# Patient Record
Sex: Female | Born: 1950 | Hispanic: No | Smoking: Former smoker
Health system: Southern US, Community
[De-identification: ages and names within clinical notes are randomized; demographics above are authoritative.]

## PROBLEM LIST (undated history)

## (undated) DIAGNOSIS — F419 Anxiety disorder, unspecified: Secondary | ICD-10-CM

## (undated) HISTORY — DX: Anxiety disorder, unspecified: F41.9

## (undated) HISTORY — PX: BLADDER SUSPENSION: SHX72

## (undated) HISTORY — PX: TUBAL LIGATION: SHX77

## (undated) HISTORY — PX: INCONTINENCE SURGERY: SHX676

---

## 1998-07-07 ENCOUNTER — Other Ambulatory Visit: Admission: RE | Admit: 1998-07-07 | Discharge: 1998-07-07 | Payer: Self-pay | Admitting: Gynecology

## 2000-12-11 ENCOUNTER — Other Ambulatory Visit: Admission: RE | Admit: 2000-12-11 | Discharge: 2000-12-11 | Payer: Self-pay | Admitting: Gynecology

## 2003-08-29 ENCOUNTER — Other Ambulatory Visit: Admission: RE | Admit: 2003-08-29 | Discharge: 2003-08-29 | Payer: Self-pay | Admitting: Obstetrics and Gynecology

## 2004-12-31 ENCOUNTER — Other Ambulatory Visit: Admission: RE | Admit: 2004-12-31 | Discharge: 2004-12-31 | Payer: Self-pay | Admitting: Obstetrics and Gynecology

## 2009-08-06 ENCOUNTER — Other Ambulatory Visit: Admission: RE | Admit: 2009-08-06 | Discharge: 2009-08-06 | Payer: Self-pay | Admitting: Obstetrics and Gynecology

## 2012-02-06 ENCOUNTER — Other Ambulatory Visit (HOSPITAL_COMMUNITY): Payer: Self-pay | Admitting: Gynecology

## 2012-02-06 DIAGNOSIS — N950 Postmenopausal bleeding: Secondary | ICD-10-CM

## 2012-02-09 ENCOUNTER — Ambulatory Visit (HOSPITAL_COMMUNITY): Payer: Self-pay

## 2015-01-28 ENCOUNTER — Ambulatory Visit: Payer: Self-pay | Admitting: Internal Medicine

## 2015-10-28 DIAGNOSIS — Z960 Presence of urogenital implants: Secondary | ICD-10-CM | POA: Diagnosis not present

## 2015-10-28 DIAGNOSIS — N8110 Cystocele, unspecified: Secondary | ICD-10-CM | POA: Diagnosis not present

## 2015-10-28 DIAGNOSIS — N819 Female genital prolapse, unspecified: Secondary | ICD-10-CM | POA: Diagnosis not present

## 2015-12-22 DIAGNOSIS — H2513 Age-related nuclear cataract, bilateral: Secondary | ICD-10-CM | POA: Diagnosis not present

## 2015-12-22 DIAGNOSIS — H547 Unspecified visual loss: Secondary | ICD-10-CM | POA: Diagnosis not present

## 2015-12-28 DIAGNOSIS — E785 Hyperlipidemia, unspecified: Secondary | ICD-10-CM | POA: Diagnosis not present

## 2015-12-28 DIAGNOSIS — N8110 Cystocele, unspecified: Secondary | ICD-10-CM | POA: Diagnosis not present

## 2015-12-28 DIAGNOSIS — M8580 Other specified disorders of bone density and structure, unspecified site: Secondary | ICD-10-CM | POA: Diagnosis not present

## 2015-12-28 DIAGNOSIS — Z0000 Encounter for general adult medical examination without abnormal findings: Secondary | ICD-10-CM | POA: Diagnosis not present

## 2016-02-01 DIAGNOSIS — N812 Incomplete uterovaginal prolapse: Secondary | ICD-10-CM | POA: Diagnosis not present

## 2016-05-31 DIAGNOSIS — N898 Other specified noninflammatory disorders of vagina: Secondary | ICD-10-CM | POA: Diagnosis not present

## 2016-05-31 DIAGNOSIS — N8111 Cystocele, midline: Secondary | ICD-10-CM | POA: Diagnosis not present

## 2016-07-20 DIAGNOSIS — N812 Incomplete uterovaginal prolapse: Secondary | ICD-10-CM | POA: Diagnosis not present

## 2016-07-29 DIAGNOSIS — N898 Other specified noninflammatory disorders of vagina: Secondary | ICD-10-CM | POA: Diagnosis not present

## 2016-09-22 DIAGNOSIS — Z124 Encounter for screening for malignant neoplasm of cervix: Secondary | ICD-10-CM | POA: Diagnosis not present

## 2016-09-22 DIAGNOSIS — N8110 Cystocele, unspecified: Secondary | ICD-10-CM | POA: Diagnosis not present

## 2016-10-12 DIAGNOSIS — N814 Uterovaginal prolapse, unspecified: Secondary | ICD-10-CM | POA: Diagnosis not present

## 2016-10-26 DIAGNOSIS — N814 Uterovaginal prolapse, unspecified: Secondary | ICD-10-CM | POA: Diagnosis not present

## 2016-11-30 DIAGNOSIS — N814 Uterovaginal prolapse, unspecified: Secondary | ICD-10-CM | POA: Diagnosis not present

## 2016-12-16 DIAGNOSIS — R110 Nausea: Secondary | ICD-10-CM | POA: Diagnosis not present

## 2016-12-16 DIAGNOSIS — R21 Rash and other nonspecific skin eruption: Secondary | ICD-10-CM | POA: Diagnosis not present

## 2016-12-16 DIAGNOSIS — N8110 Cystocele, unspecified: Secondary | ICD-10-CM | POA: Diagnosis not present

## 2016-12-16 DIAGNOSIS — R42 Dizziness and giddiness: Secondary | ICD-10-CM | POA: Diagnosis not present

## 2017-01-05 ENCOUNTER — Ambulatory Visit: Payer: PPO | Admitting: Neurology

## 2017-01-18 DIAGNOSIS — N952 Postmenopausal atrophic vaginitis: Secondary | ICD-10-CM | POA: Diagnosis not present

## 2017-01-18 DIAGNOSIS — N814 Uterovaginal prolapse, unspecified: Secondary | ICD-10-CM | POA: Diagnosis not present

## 2017-01-18 DIAGNOSIS — Z01419 Encounter for gynecological examination (general) (routine) without abnormal findings: Secondary | ICD-10-CM | POA: Diagnosis not present

## 2017-01-18 DIAGNOSIS — N8111 Cystocele, midline: Secondary | ICD-10-CM | POA: Diagnosis not present

## 2017-03-13 DIAGNOSIS — N3946 Mixed incontinence: Secondary | ICD-10-CM | POA: Diagnosis not present

## 2017-03-13 DIAGNOSIS — N813 Complete uterovaginal prolapse: Secondary | ICD-10-CM | POA: Diagnosis not present

## 2017-03-13 DIAGNOSIS — N950 Postmenopausal bleeding: Secondary | ICD-10-CM | POA: Diagnosis not present

## 2017-05-26 DIAGNOSIS — N8111 Cystocele, midline: Secondary | ICD-10-CM | POA: Diagnosis not present

## 2017-05-26 DIAGNOSIS — N819 Female genital prolapse, unspecified: Secondary | ICD-10-CM | POA: Diagnosis not present

## 2017-05-26 DIAGNOSIS — N813 Complete uterovaginal prolapse: Secondary | ICD-10-CM | POA: Diagnosis not present

## 2017-05-26 DIAGNOSIS — N816 Rectocele: Secondary | ICD-10-CM | POA: Diagnosis not present

## 2017-07-04 DIAGNOSIS — N814 Uterovaginal prolapse, unspecified: Secondary | ICD-10-CM | POA: Diagnosis not present

## 2017-07-26 DIAGNOSIS — N813 Complete uterovaginal prolapse: Secondary | ICD-10-CM | POA: Diagnosis not present

## 2017-07-26 DIAGNOSIS — N950 Postmenopausal bleeding: Secondary | ICD-10-CM | POA: Diagnosis not present

## 2017-08-04 DIAGNOSIS — N8110 Cystocele, unspecified: Secondary | ICD-10-CM | POA: Diagnosis not present

## 2017-08-04 DIAGNOSIS — N813 Complete uterovaginal prolapse: Secondary | ICD-10-CM | POA: Diagnosis not present

## 2017-08-08 DIAGNOSIS — N8110 Cystocele, unspecified: Secondary | ICD-10-CM | POA: Diagnosis not present

## 2017-08-08 DIAGNOSIS — N816 Rectocele: Secondary | ICD-10-CM | POA: Diagnosis not present

## 2017-08-31 DIAGNOSIS — N8110 Cystocele, unspecified: Secondary | ICD-10-CM | POA: Diagnosis not present

## 2017-09-28 DIAGNOSIS — N814 Uterovaginal prolapse, unspecified: Secondary | ICD-10-CM | POA: Diagnosis not present

## 2017-12-27 DIAGNOSIS — N814 Uterovaginal prolapse, unspecified: Secondary | ICD-10-CM | POA: Diagnosis not present

## 2018-01-04 DIAGNOSIS — Z1389 Encounter for screening for other disorder: Secondary | ICD-10-CM | POA: Diagnosis not present

## 2018-01-04 DIAGNOSIS — F5101 Primary insomnia: Secondary | ICD-10-CM | POA: Diagnosis not present

## 2018-01-04 DIAGNOSIS — Z1211 Encounter for screening for malignant neoplasm of colon: Secondary | ICD-10-CM | POA: Diagnosis not present

## 2018-01-04 DIAGNOSIS — Z975 Presence of (intrauterine) contraceptive device: Secondary | ICD-10-CM | POA: Diagnosis not present

## 2018-01-04 DIAGNOSIS — N8110 Cystocele, unspecified: Secondary | ICD-10-CM | POA: Diagnosis not present

## 2018-01-16 DIAGNOSIS — N814 Uterovaginal prolapse, unspecified: Secondary | ICD-10-CM | POA: Diagnosis not present

## 2018-06-05 DIAGNOSIS — N765 Ulceration of vagina: Secondary | ICD-10-CM | POA: Diagnosis not present

## 2018-06-05 DIAGNOSIS — N819 Female genital prolapse, unspecified: Secondary | ICD-10-CM | POA: Diagnosis not present

## 2018-07-03 DIAGNOSIS — N766 Ulceration of vulva: Secondary | ICD-10-CM | POA: Diagnosis not present

## 2018-07-03 DIAGNOSIS — N889 Noninflammatory disorder of cervix uteri, unspecified: Secondary | ICD-10-CM | POA: Diagnosis not present

## 2018-07-03 DIAGNOSIS — N814 Uterovaginal prolapse, unspecified: Secondary | ICD-10-CM | POA: Diagnosis not present

## 2018-07-03 DIAGNOSIS — N952 Postmenopausal atrophic vaginitis: Secondary | ICD-10-CM | POA: Diagnosis not present

## 2018-07-03 DIAGNOSIS — N72 Inflammatory disease of cervix uteri: Secondary | ICD-10-CM | POA: Diagnosis not present

## 2018-07-03 DIAGNOSIS — Z87891 Personal history of nicotine dependence: Secondary | ICD-10-CM | POA: Diagnosis not present

## 2018-07-03 DIAGNOSIS — Z01419 Encounter for gynecological examination (general) (routine) without abnormal findings: Secondary | ICD-10-CM | POA: Diagnosis not present

## 2018-07-03 DIAGNOSIS — Z124 Encounter for screening for malignant neoplasm of cervix: Secondary | ICD-10-CM | POA: Diagnosis not present

## 2018-07-17 DIAGNOSIS — Z0000 Encounter for general adult medical examination without abnormal findings: Secondary | ICD-10-CM | POA: Diagnosis not present

## 2018-07-17 DIAGNOSIS — F5101 Primary insomnia: Secondary | ICD-10-CM | POA: Diagnosis not present

## 2018-07-17 DIAGNOSIS — M8588 Other specified disorders of bone density and structure, other site: Secondary | ICD-10-CM | POA: Diagnosis not present

## 2018-07-17 DIAGNOSIS — E559 Vitamin D deficiency, unspecified: Secondary | ICD-10-CM | POA: Diagnosis not present

## 2018-07-17 DIAGNOSIS — E785 Hyperlipidemia, unspecified: Secondary | ICD-10-CM | POA: Diagnosis not present

## 2018-07-17 DIAGNOSIS — N8110 Cystocele, unspecified: Secondary | ICD-10-CM | POA: Diagnosis not present

## 2018-07-24 DIAGNOSIS — N889 Noninflammatory disorder of cervix uteri, unspecified: Secondary | ICD-10-CM | POA: Diagnosis not present

## 2018-08-14 DIAGNOSIS — Z8742 Personal history of other diseases of the female genital tract: Secondary | ICD-10-CM | POA: Diagnosis not present

## 2018-08-14 DIAGNOSIS — N813 Complete uterovaginal prolapse: Secondary | ICD-10-CM | POA: Diagnosis not present

## 2018-08-14 DIAGNOSIS — N816 Rectocele: Secondary | ICD-10-CM | POA: Diagnosis not present

## 2018-08-14 DIAGNOSIS — N8110 Cystocele, unspecified: Secondary | ICD-10-CM | POA: Diagnosis not present

## 2018-09-18 DIAGNOSIS — N8110 Cystocele, unspecified: Secondary | ICD-10-CM | POA: Diagnosis not present

## 2018-09-18 DIAGNOSIS — N814 Uterovaginal prolapse, unspecified: Secondary | ICD-10-CM | POA: Diagnosis not present

## 2018-09-18 DIAGNOSIS — N816 Rectocele: Secondary | ICD-10-CM | POA: Diagnosis not present

## 2019-02-20 DIAGNOSIS — R05 Cough: Secondary | ICD-10-CM | POA: Diagnosis not present

## 2019-03-11 DIAGNOSIS — N8110 Cystocele, unspecified: Secondary | ICD-10-CM | POA: Diagnosis not present

## 2019-05-08 DIAGNOSIS — N814 Uterovaginal prolapse, unspecified: Secondary | ICD-10-CM | POA: Diagnosis not present

## 2019-05-08 DIAGNOSIS — N813 Complete uterovaginal prolapse: Secondary | ICD-10-CM | POA: Diagnosis not present

## 2019-05-08 DIAGNOSIS — N950 Postmenopausal bleeding: Secondary | ICD-10-CM | POA: Diagnosis not present

## 2019-06-13 DIAGNOSIS — N939 Abnormal uterine and vaginal bleeding, unspecified: Secondary | ICD-10-CM | POA: Diagnosis not present

## 2019-06-13 DIAGNOSIS — R198 Other specified symptoms and signs involving the digestive system and abdomen: Secondary | ICD-10-CM | POA: Diagnosis not present

## 2019-06-13 DIAGNOSIS — R05 Cough: Secondary | ICD-10-CM | POA: Diagnosis not present

## 2019-06-17 ENCOUNTER — Other Ambulatory Visit: Payer: Self-pay

## 2019-06-17 ENCOUNTER — Ambulatory Visit
Admission: RE | Admit: 2019-06-17 | Discharge: 2019-06-17 | Disposition: A | Discharge status: Home / Self Care | Payer: PPO | Source: Ambulatory Visit | Attending: Family Medicine | Admitting: Family Medicine

## 2019-06-17 ENCOUNTER — Other Ambulatory Visit: Payer: Self-pay | Admitting: Family Medicine

## 2019-06-17 DIAGNOSIS — R05 Cough: Secondary | ICD-10-CM | POA: Diagnosis not present

## 2019-06-17 DIAGNOSIS — R059 Cough, unspecified: Secondary | ICD-10-CM

## 2019-06-20 DIAGNOSIS — N765 Ulceration of vagina: Secondary | ICD-10-CM | POA: Diagnosis not present

## 2019-06-20 DIAGNOSIS — N813 Complete uterovaginal prolapse: Secondary | ICD-10-CM | POA: Diagnosis not present

## 2019-06-25 DIAGNOSIS — N8110 Cystocele, unspecified: Secondary | ICD-10-CM | POA: Diagnosis not present

## 2019-06-28 DIAGNOSIS — Z01810 Encounter for preprocedural cardiovascular examination: Secondary | ICD-10-CM | POA: Diagnosis not present

## 2019-06-28 DIAGNOSIS — N8110 Cystocele, unspecified: Secondary | ICD-10-CM | POA: Diagnosis not present

## 2019-06-28 DIAGNOSIS — Z20828 Contact with and (suspected) exposure to other viral communicable diseases: Secondary | ICD-10-CM | POA: Diagnosis not present

## 2019-06-28 DIAGNOSIS — Z01812 Encounter for preprocedural laboratory examination: Secondary | ICD-10-CM | POA: Diagnosis not present

## 2019-06-28 DIAGNOSIS — R001 Bradycardia, unspecified: Secondary | ICD-10-CM | POA: Diagnosis not present

## 2019-07-04 DIAGNOSIS — N816 Rectocele: Secondary | ICD-10-CM | POA: Diagnosis not present

## 2019-07-04 DIAGNOSIS — Z9889 Other specified postprocedural states: Secondary | ICD-10-CM | POA: Diagnosis not present

## 2019-07-04 DIAGNOSIS — N8110 Cystocele, unspecified: Secondary | ICD-10-CM | POA: Diagnosis not present

## 2019-07-04 DIAGNOSIS — N813 Complete uterovaginal prolapse: Secondary | ICD-10-CM | POA: Diagnosis not present

## 2019-07-22 DIAGNOSIS — N8110 Cystocele, unspecified: Secondary | ICD-10-CM | POA: Diagnosis not present

## 2019-09-24 ENCOUNTER — Other Ambulatory Visit: Payer: Self-pay | Admitting: Family Medicine

## 2019-09-24 DIAGNOSIS — Z1231 Encounter for screening mammogram for malignant neoplasm of breast: Secondary | ICD-10-CM

## 2019-09-30 DIAGNOSIS — N8110 Cystocele, unspecified: Secondary | ICD-10-CM | POA: Diagnosis not present

## 2020-01-14 DIAGNOSIS — F419 Anxiety disorder, unspecified: Secondary | ICD-10-CM | POA: Diagnosis not present

## 2020-01-21 DIAGNOSIS — R42 Dizziness and giddiness: Secondary | ICD-10-CM | POA: Diagnosis not present

## 2020-01-21 DIAGNOSIS — R05 Cough: Secondary | ICD-10-CM | POA: Diagnosis not present

## 2020-01-28 DIAGNOSIS — I8310 Varicose veins of unspecified lower extremity with inflammation: Secondary | ICD-10-CM | POA: Diagnosis not present

## 2020-03-09 DIAGNOSIS — K219 Gastro-esophageal reflux disease without esophagitis: Secondary | ICD-10-CM | POA: Diagnosis not present

## 2020-03-09 DIAGNOSIS — R6889 Other general symptoms and signs: Secondary | ICD-10-CM | POA: Diagnosis not present

## 2020-03-09 DIAGNOSIS — H6121 Impacted cerumen, right ear: Secondary | ICD-10-CM | POA: Diagnosis not present

## 2020-03-09 DIAGNOSIS — R42 Dizziness and giddiness: Secondary | ICD-10-CM | POA: Diagnosis not present

## 2020-03-09 DIAGNOSIS — Z87898 Personal history of other specified conditions: Secondary | ICD-10-CM | POA: Diagnosis not present

## 2020-05-28 DIAGNOSIS — R21 Rash and other nonspecific skin eruption: Secondary | ICD-10-CM | POA: Diagnosis not present

## 2020-05-28 DIAGNOSIS — J358 Other chronic diseases of tonsils and adenoids: Secondary | ICD-10-CM | POA: Diagnosis not present

## 2020-06-30 DIAGNOSIS — L708 Other acne: Secondary | ICD-10-CM | POA: Diagnosis not present

## 2020-06-30 DIAGNOSIS — L820 Inflamed seborrheic keratosis: Secondary | ICD-10-CM | POA: Diagnosis not present

## 2020-07-03 DIAGNOSIS — N8110 Cystocele, unspecified: Secondary | ICD-10-CM | POA: Diagnosis not present

## 2020-12-06 IMAGING — CR CHEST - 2 VIEW
2 series · 2 of 2 positions shown · non-contrast
Comparison: None.

CLINICAL DATA: Pt had dry cough since [DATE], no other symptoms,
non-smoker, not a call [id number]

EXAM:
CHEST - 2 VIEW

[w chest pa]
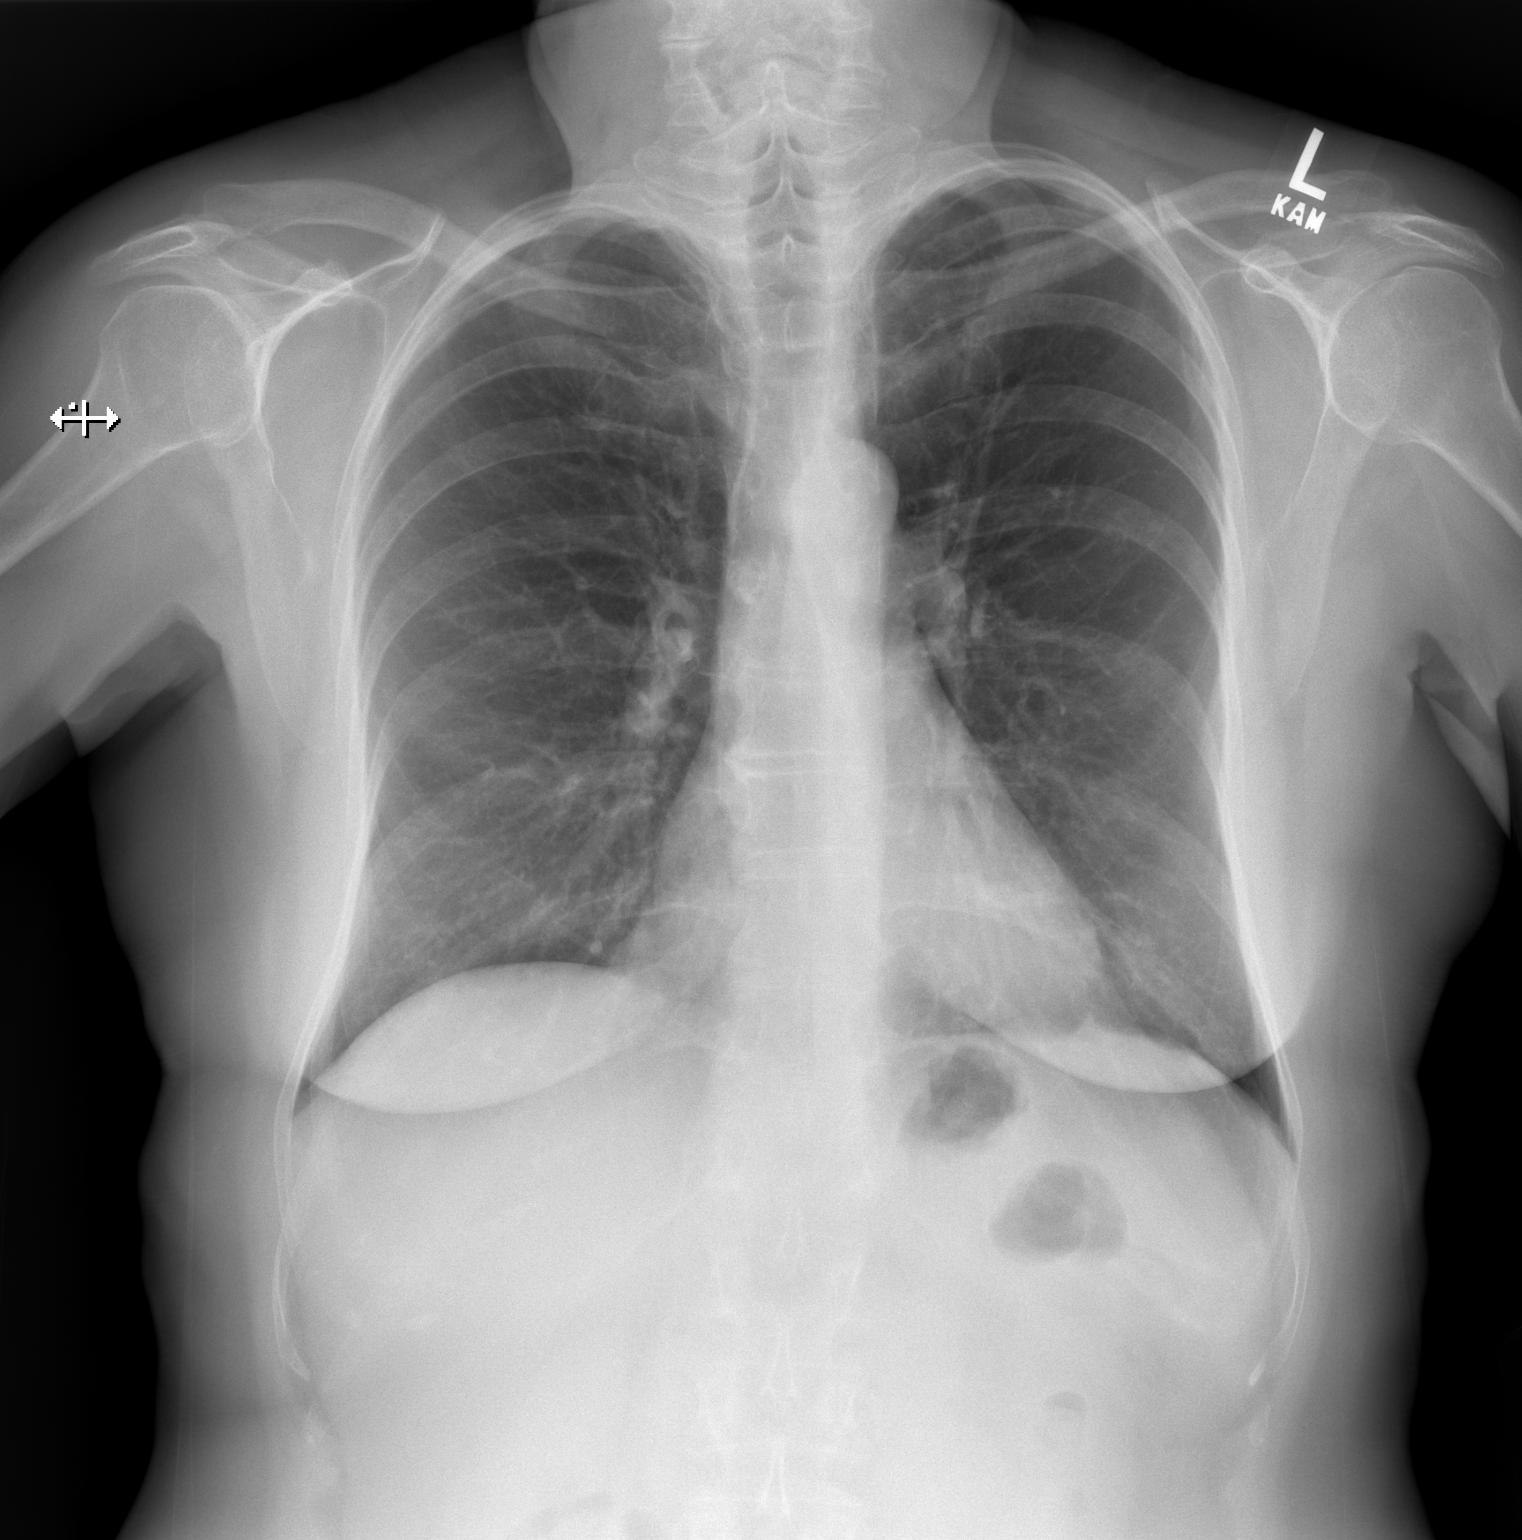

[w chest lat]
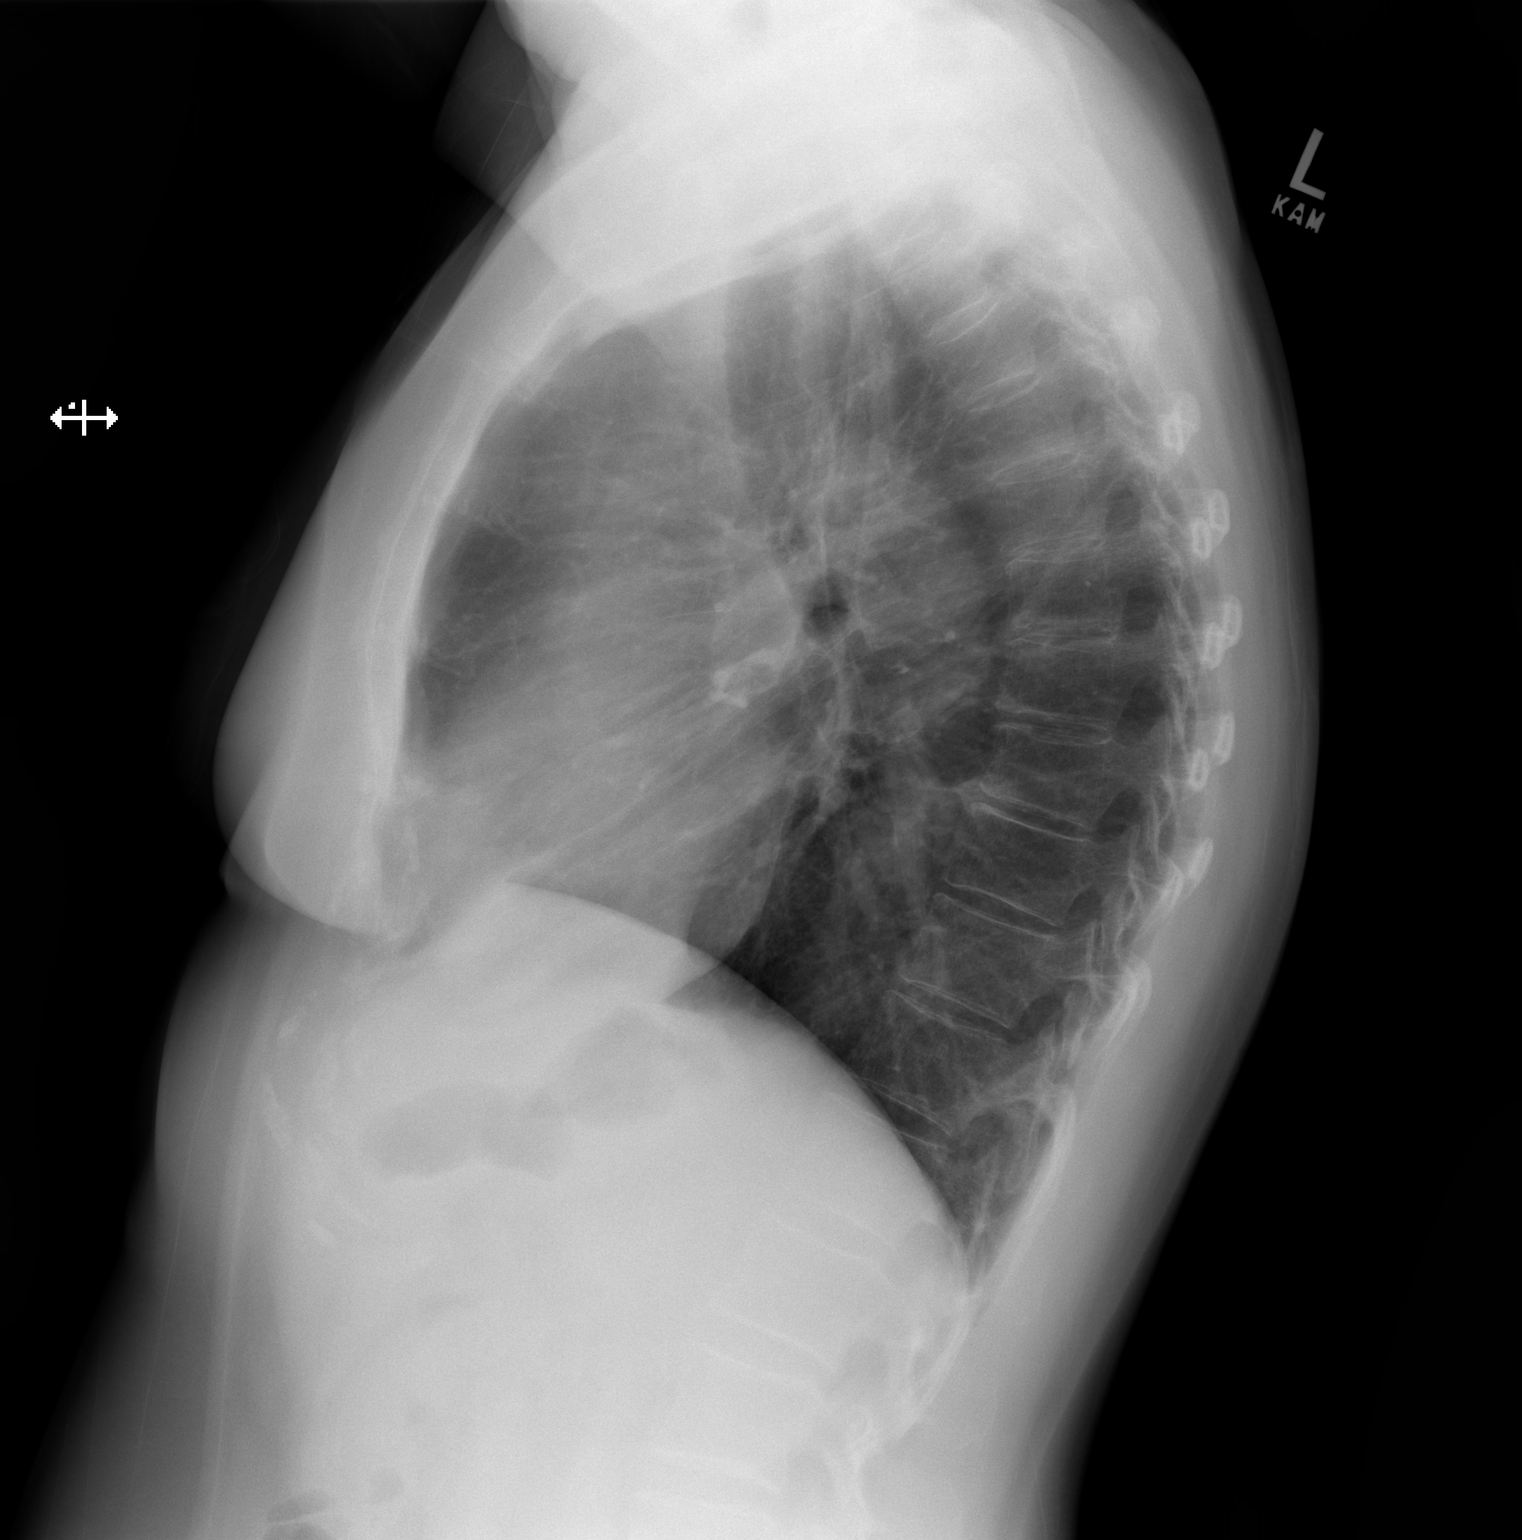

[2 of 2 positions shown; findings below may reference images not displayed]

FINDINGS: The heart size and mediastinal contours are within normal limits.
The lungs are clear. No pneumothorax or pleural effusion. The
visualized skeletal structures and upper abdomen are unremarkable.
IMPRESSION: No active cardiopulmonary disease.

## 2021-01-30 ENCOUNTER — Other Ambulatory Visit: Payer: Self-pay | Admitting: Family Medicine

## 2021-01-30 DIAGNOSIS — N951 Menopausal and female climacteric states: Secondary | ICD-10-CM

## 2021-02-05 ENCOUNTER — Other Ambulatory Visit: Payer: Self-pay | Admitting: Family Medicine

## 2021-02-05 DIAGNOSIS — Z1231 Encounter for screening mammogram for malignant neoplasm of breast: Secondary | ICD-10-CM

## 2021-07-14 ENCOUNTER — Other Ambulatory Visit: Payer: PPO

## 2022-06-14 ENCOUNTER — Ambulatory Visit (INDEPENDENT_AMBULATORY_CARE_PROVIDER_SITE_OTHER): Payer: PPO | Admitting: Obstetrics and Gynecology

## 2022-06-14 ENCOUNTER — Other Ambulatory Visit (HOSPITAL_COMMUNITY)
Admission: RE | Admit: 2022-06-14 | Discharge: 2022-06-14 | Disposition: A | Discharge status: Home / Self Care | Payer: PPO | Attending: Obstetrics and Gynecology | Admitting: Obstetrics and Gynecology

## 2022-06-14 ENCOUNTER — Encounter: Payer: Self-pay | Admitting: Obstetrics and Gynecology

## 2022-06-14 VITALS — BP 129/75 | HR 58 | Ht 62.0 in | Wt 138.0 lb

## 2022-06-14 DIAGNOSIS — N816 Rectocele: Secondary | ICD-10-CM | POA: Insufficient documentation

## 2022-06-14 DIAGNOSIS — R350 Frequency of micturition: Secondary | ICD-10-CM

## 2022-06-14 DIAGNOSIS — R82998 Other abnormal findings in urine: Secondary | ICD-10-CM

## 2022-06-14 DIAGNOSIS — N952 Postmenopausal atrophic vaginitis: Secondary | ICD-10-CM | POA: Diagnosis not present

## 2022-06-14 LAB — POCT URINALYSIS DIPSTICK
Bilirubin, UA: NEGATIVE
Blood, UA: NEGATIVE
Glucose, UA: NEGATIVE
Ketones, UA: NEGATIVE
Nitrite, UA: NEGATIVE
Protein, UA: NEGATIVE
Spec Grav, UA: 1.025 (ref 1.010–1.025)
Urobilinogen, UA: 0.2 E.U./dL
pH, UA: 6.5 (ref 5.0–8.0)

## 2022-06-14 MED ORDER — ESTRADIOL 0.1 MG/GM VA CREA: TOPICAL_CREAM | VAGINAL | 11 refills | Status: AC

## 2022-06-14 NOTE — Progress Notes (Unsigned)
[facility] New Patient Evaluation and Consultation  Referring Provider: Laurann Montana, MD PCP: Laurann Montana, MD Date of Service: 06/14/2022  SUBJECTIVE Chief Complaint: New Patient (Initial Visit) Allison Richards is a 71 y.o. female here for a consult for bleeding after an ablation. Pt said she also wants surgery for prolapse)  History of Present Illness: Allison Richards is a 71 y.o. White or Caucasian female presenting for evaluation of prolapse.    Review of records significant for: Has recurrence of prolapse symptoms. Had surgery in 2020 for prolapse.   Had some postmenopausal bleeding and D&C in 10/2021 showed benign endometrial polyp and endometrial glands.   Pelvic US scanned into media.   Urinary Symptoms: Does not leak urine.  Pad use: 3- 4 liners/ mini-pads per day for vaginal discharge/ drainage- reports that the drainage does not have an odor, does not think it is urine.  She is bothered by her UI symptoms.  Day time voids- "normal" every few hours.  Nocturia: 1-2 times per night to void. Voiding dysfunction: she empties her bladder well.  does not use a catheter to empty bladder.  When urinating, she feels the need to urinate multiple times in a row   UTIs:  0  UTI's in the last year.   Denies history of blood in urine and kidney or bladder stones  Pelvic Organ Prolapse Symptoms:                  She Admits to a feeling of a bulge the vaginal area. It has been present for 4- 5 years.  She Admits to seeing a bulge.  This bulge is bothersome. Previously used a pessary (#5 ring with support). Has also done pelvic physical therapy.  Had surgery with Dr Cleat[facility]) in Sept 2020.   Prior operative notes reviewed- sacrospinous ligament fixation was performed with anterior graft fixated to the cervix. Graft type not mentioned. Prolene sutures were placed in the sacospinous ligament and Maxon sutures were placed at the cervix. A  coloplast midurethral sling was placed- unclear if TVT or TOT (pt does not recall any incisions above the pubic bone).   Bowel Symptom: Bowel movements: daily Stool consistency: soft  Straining: yes.  Splinting: yes.  Incomplete evacuation: yes.  She Denies accidental bowel leakage / fecal incontinence Bowel regimen:  sometimes uses miralax  Sexual Function Sexually active: no.   Pain with sex: has discomfort due to dryness  Pelvic Pain Denies pelvic pain  Past Medical History: History reviewed. No pertinent past medical history.   Past Surgical History:   Past Surgical History:  Procedure Laterality Date   BLADDER SUSPENSION     sacrospinous fixation with anterior graft   INCONTINENCE SURGERY     TUBAL LIGATION       Past OB/GYN History: OB History  Gravida Para Term Preterm AB Living  1 1 1     1   SAB IAB Ectopic Multiple Live Births          1    # Outcome Date GA Lbr Len/2nd Weight Sex Delivery Anes PTL Lv  1 Term      Vag-Forceps      Menopausal: Admits to vaginal bleeding since menopause- had a D&C performed which showed benign endometrial polyp.  Any history of abnormal pap smears: no.   Medications: She has a current medication list which includes the following prescription(s): alprazolam.   Allergies: Patient has no allergies on file.  Social History:  Social History   Tobacco Use   Smoking status: Former    Types: Cigarettes    Quit date: 1968    Years since quitting: 55.6    Passive exposure: Past   Smokeless tobacco: Never  Vaping Use   Vaping Use: Never used  Substance Use Topics   Alcohol use: Not Currently   Drug use: Never    Relationship status: married She lives with husband.   She is retired Regular exercise: No History of abuse: No  Family History:  History reviewed. No pertinent family history.   Review of Systems: Review of Systems  Constitutional:  Negative for fever, malaise/fatigue and weight loss.  Respiratory:   Negative for cough, shortness of breath and wheezing.   Cardiovascular:  Negative for chest pain, palpitations and leg swelling.  Gastrointestinal:  Negative for abdominal pain and blood in stool.  Genitourinary:  Negative for dysuria.       + vaginal discharge  Musculoskeletal:  Negative for myalgias.  Skin:  Negative for rash.  Neurological:  Negative for dizziness and headaches.  Endo/Heme/Allergies:  Does not bruise/bleed easily.  Psychiatric/Behavioral:  Negative for depression. The patient is not nervous/anxious.      OBJECTIVE Physical Exam: Vitals:   06/14/22 1107  BP: 129/75  Pulse: (!) 58  Weight: 138 lb (62.6 kg)  Height: 5\' 2"  (1.575 m)    Physical Exam   GU / Detailed Urogynecologic Evaluation:  Pelvic Exam: Normal external female genitalia; Bartholin's and Skene's glands normal in appearance; urethral meatus normal in appearance, no urethral masses or discharge.   CST: {gen negative/positive:315881}  Reflexes: bulbocavernosis {DESC; PRESENT/NOT PRESENT:21021351}, anocutaneous {DESC; PRESENT/NOT PRESENT:21021351} ***bilaterally.  Speculum exam reveals normal vaginal mucosa {With/Without:20273} atrophy. Cervix {exam; gyn cervix:30847}. Uterus {exam; pelvic uterus:30849}. Adnexa {exam; adnexa:12223}.    s/p hysterectomy: Speculum exam reveals normal vaginal mucosa {With/Without:20273}  atrophy and normal vaginal cuff.  Adnexa {exam; adnexa:12223}.    With apex supported, anterior compartment defect was {reduced:24765}  Pelvic floor strength {Roman # I-V:19040}/V, puborectalis {Roman # I-V:19040}/V external anal sphincter {Roman # I-V:19040}/V  Pelvic floor musculature: Right levator {Tender/Non-tender:20250}, Right obturator {Tender/Non-tender:20250}, Left levator {Tender/Non-tender:20250}, Left obturator {Tender/Non-tender:20250}  POP-Q:   POP-Q                                               Aa                                               Ba                                                  C                                                Gh  Pb                                               tvl                                                Ap                                               Bp                                                 D     Rectal Exam:  Normal sphincter tone, {rectocele:24766} distal rectocele, enterocoele {DESC; PRESENT/NOT PRESENT:21021351}, no rectal masses, {sign of:24767} dyssynergia when asking the patient to bear down.  Post-Void Residual (PVR) by Bladder Scan: In order to evaluate bladder emptying, we discussed obtaining a postvoid residual and she agreed to this procedure.  Procedure: The ultrasound unit was placed on the patient's abdomen in the suprapubic region after the patient had voided. A PVR of 22 ml was obtained by bladder scan.  Laboratory Results: POC urine: small leukocytes, negative nitrites  ***I visualized the urine specimen, noting the specimen to be {urine color:24768}  ASSESSMENT AND PLAN Allison Richards is a 72 y.o. with:  1. Urinary frequency       Marguerita Beards, MD   Medical Decision Making:  - Reviewed/ ordered a clinical laboratory test - Reviewed/ ordered a radiologic study - Reviewed/ ordered medicine test - Decision to obtain old records - Discussion of management of or test interpretation with an external physician / other healthcare professional  - Assessment requiring independent historian - Review and summation of prior records - Independent review of image, tracing or specimen

## 2022-06-14 NOTE — Patient Instructions (Addendum)
You have a stage 2 (out of 4) prolapse.  We discussed the fact that it is not life threatening but there are several treatment options. For treatment of pelvic organ prolapse, we discussed options for management including expectant management, conservative management, and surgical management, such as Kegels, a pessary, pelvic floor physical therapy, and specific surgical procedures.  Let us know what you decide.    Take Azo over the counter- can turn your urine orange and if your pad is orange then you know that you are leaking urine.   Start vaginal estrogen therapy nightly for two weeks then 2 times weekly at night for treatment of vaginal atrophy (dryness of the vaginal tissues).  Please let us know if the prescription is too expensive and we can look for alternative options.

## 2022-06-17 LAB — URINE CULTURE: Culture: <10000 — AB

## 2022-12-02 ENCOUNTER — Ambulatory Visit (INDEPENDENT_AMBULATORY_CARE_PROVIDER_SITE_OTHER): Payer: PPO | Admitting: Gastroenterology

## 2022-12-02 ENCOUNTER — Encounter: Payer: Self-pay | Admitting: Gastroenterology

## 2022-12-02 VITALS — BP 116/72 | HR 87 | Ht 62.0 in | Wt 142.0 lb

## 2022-12-02 DIAGNOSIS — Z1212 Encounter for screening for malignant neoplasm of rectum: Secondary | ICD-10-CM

## 2022-12-02 DIAGNOSIS — Z1211 Encounter for screening for malignant neoplasm of colon: Secondary | ICD-10-CM | POA: Diagnosis not present

## 2022-12-02 NOTE — Progress Notes (Signed)
Chief Complaint: CRC screening  Referring Provider: Dr. Lilian Coma   ASSESSMENT AND PLAN;   #1. CRC screening  #2. Uterine prolapse, awaiting vaginal hysterectomy/BSO 3/22  Plan:  -Colon (after Sx). She will let us know when she decides after she recovers fully.  I have explained the need. -Recommend Zofran 4 mg IV before anesthesia. -Decrease probitioc QOD. -Call if with any problems   Discussed risks & benefits of colonoscopy. Risks including rare perforation req laparotomy, bleeding after bx/polypectomy req blood transfusion, rarely missing neoplasms, risks of anesthesia/sedation, rare risk of damage to internal organs. Benefits outweigh the risks. Patient will let us know when she decides to proceed.  HPI:    Allison Richards is a 72 y.o. female  Awaiting vaginal hysterectomy with BSO, uterosacral ligament suspension, and mid vaginal repairs for utreine prolapse 3/22.  Advised screening colonoscopy.  She wants to get it done after surgery  No nausea, vomiting, heartburn (occ, at night, resolves with Pepcid AC prn), regurgitation, odynophagia or dysphagia.  No significant diarrhea or constipation (now going 2-3 BMs/day on probiotics, occ postprandial diarrhea with bloating-may have underlying IBS).  No melena or hematochezia. No unintentional weight loss. No abdominal pain.  "Very sensitive to anesthesia"-has N/V postoperatively-given scopolamine patch previously-which did help somewhat.  Has never tried Zofran.  Occ vertigo which does result in nausea but no vomiting- BPPV by H/O.   SH- daughter is pt of ours  Wt Readings from Last 3 Encounters:  12/02/22 142 lb (64.4 kg)  06/14/22 138 lb (62.6 kg)     Past Medical History:  Diagnosis Date   Anxiety     Past Surgical History:  Procedure Laterality Date   BLADDER SUSPENSION     sacrospinous fixation with anterior graft   INCONTINENCE SURGERY     TUBAL LIGATION      Family History  Problem Relation  Age of Onset   Bone cancer Brother    Ovarian cancer Maternal Grandmother     Social History   Tobacco Use   Smoking status: Former    Types: Cigarettes    Quit date: 1968    Years since quitting: 56.1    Passive exposure: Past   Smokeless tobacco: Never  Vaping Use   Vaping Use: Never used  Substance Use Topics   Alcohol use: Not Currently   Drug use: Never    Current Outpatient Medications  Medication Sig Dispense Refill   ALPRAZolam (XANAX) 0.5 MG tablet Take 0.5 mg by mouth at bedtime as needed for anxiety.     estradiol (ESTRACE) 0.1 MG/GM vaginal cream Place 0.5g nightly for two weeks then twice a week after 30 g 11   Lactobacillus Rhamnosus, GG, (CULTURELLE) CAPS Take 1 capsule by mouth daily.     No current facility-administered medications for this visit.    No Known Allergies  Review of Systems:  Constitutional: Denies fever, chills, diaphoresis, appetite change and fatigue.  HEENT: Denies photophobia, eye pain, redness, hearing loss, ear pain, congestion, sore throat, rhinorrhea, sneezing, mouth sores, neck pain, neck stiffness and tinnitus.   Respiratory: Denies SOB, DOE, cough, chest tightness,  and wheezing.   Cardiovascular: Denies chest pain, palpitations and leg swelling.  Genitourinary: Denies dysuria, urgency, frequency, hematuria, flank pain and difficulty urinating.  Musculoskeletal: Denies myalgias, back pain, joint swelling, arthralgias and gait problem.  Skin: No rash.  Neurological: Denies dizziness, seizures, syncope, weakness, light-headedness, numbness and headaches.  Hematological: Denies adenopathy. Easy bruising, personal or family bleeding  history  Psychiatric/Behavioral: has anxiety, no depression     Physical Exam:    BP 116/72   Pulse 87   Ht 5' 2"$  (1.575 m)   Wt 142 lb (64.4 kg)   BMI 25.97 kg/m  Wt Readings from Last 3 Encounters:  12/02/22 142 lb (64.4 kg)  06/14/22 138 lb (62.6 kg)   Constitutional:  Well-developed, in  no acute distress. Psychiatric: Normal mood and affect. Behavior is normal. HEENT: Pupils normal.  Conjunctivae are normal. No scleral icterus.  Cardiovascular: Normal rate, regular rhythm. No edema Pulmonary/chest: Effort normal and breath sounds normal. No wheezing, rales or rhonchi. Abdominal: Soft, nondistended. Nontender. Bowel sounds active throughout. There are no masses palpable. No hepatomegaly. Rectal: Deferred Neurological: Alert and oriented to person place and time. Skin: Skin is warm and dry. No rashes noted.    Carmell Austria, MD 12/02/2022, 11:11 AM  Cc: Dr. Lilian Coma

## 2022-12-02 NOTE — Patient Instructions (Addendum)
_______________________________________________________  If your blood pressure at your visit was 140/90 or greater, please contact your primary care physician to follow up on this.  _______________________________________________________  If you are age 74 or older, your body mass index should be between 23-30. Your Body mass index is 25.97 kg/m. If this is out of the aforementioned range listed, please consider follow up with your Primary Care Provider.  If you are age 71 or younger, your body mass index should be between 19-25. Your Body mass index is 25.97 kg/m. If this is out of the aformentioned range listed, please consider follow up with your Primary Care Provider.   ________________________________________________________  The [facility] providers would like to encourage you to use [facility] to communicate with providers for non-urgent requests or questions.  Due to long hold times on the telephone, sending your provider a message by [facility] may be a faster and more efficient way to get a response.  Please allow 48 business hours for a response.  Please remember that this is for non-urgent requests.  _______________________________________________________  Probiotic every other day  It has been recommended to you by your physician that you have a colonoscopy completed. Per your request, we did not schedule the procedure(s) today. Please contact our office at [phone] should you decide to have the procedure completed. You will be scheduled for a pre-visit and procedure at that time.  Thank you,  Dr. Jackquline Denmark

## 2023-01-11 ENCOUNTER — Telehealth: Payer: Self-pay | Admitting: Obstetrics and Gynecology

## 2023-01-11 NOTE — Telephone Encounter (Signed)
Patient called today asking if Dr. Wannetta Sender was still doing surgery.  I told her that her surgery slots were booked until she goes out on leave, but will be back in August.  She said she needed a hysterectomy, and wanted someone that could do it and "know how to get around her sling."  I mentioned to her that I saw where she had been scheduled for one, and then I asked her why was it cancelled.  She said it was a long story, but it had to do with her pre-op and wouldn't say any more about it.  I told her I would take a message and have someone call her to discuss.

## 2023-01-16 NOTE — Telephone Encounter (Signed)
Called and spoke to patient. She feels very intimated by surgical talk and wanted more information on her options.  We discussed some of the options Dr. Wannetta Sender had mentioned in her last note (Sacrocolpopexy vs USLS). She is interested in USLS and posterior repair if she decides to go surgical route. She reports she will call the office back and make an appointment if she wants to proceed. Is aware Dr. Wannetta Sender will be going out on leave so surgery scheduling would be in August or September.

## 2023-01-16 NOTE — Telephone Encounter (Signed)
Yes Ma'am I told her we would need to talk to her in person and she understands. She is not looking for surgery right now. I think she was very overwhelmed with WF and her nephew is on life support in the ICU so she is not even thinking of surgery anytime soon. I instructed her that if she wanted to move forward with options to call and make an appointment to discuss surgical options.

## 2023-05-09 ENCOUNTER — Telehealth: Payer: Self-pay

## 2023-05-09 NOTE — Telephone Encounter (Signed)
Pt calling; looking for someone to do a hyst on her; she also has a bladder sling.  Adv pt we are not currently taking new pts; Suggestions given - [facility], [facility], [facility], [facility], call ins to see who is in network.  Adv pt that when she calls to let them know she has a sling and to also ask if they know of anyone that would be able to do this type of surgery. Pt does not want reconstruction.
